# Patient Record
Sex: Female | Born: 1970 | Hispanic: No | Marital: Single | Smoking: Never smoker
Health system: Southern US, Community
[De-identification: ages and names within clinical notes are randomized; demographics above are authoritative.]

---

## 2001-04-24 ENCOUNTER — Other Ambulatory Visit: Admission: RE | Admit: 2001-04-24 | Discharge: 2001-04-24 | Payer: Self-pay | Admitting: Obstetrics and Gynecology

## 2002-05-02 ENCOUNTER — Other Ambulatory Visit: Admission: RE | Admit: 2002-05-02 | Discharge: 2002-05-02 | Payer: Self-pay | Admitting: Obstetrics and Gynecology

## 2015-07-26 ENCOUNTER — Emergency Department (HOSPITAL_COMMUNITY): Payer: Worker's Compensation

## 2015-07-26 ENCOUNTER — Encounter (HOSPITAL_COMMUNITY): Payer: Self-pay | Admitting: Emergency Medicine

## 2015-07-26 ENCOUNTER — Emergency Department (HOSPITAL_COMMUNITY)
Admission: EM | Admit: 2015-07-26 | Discharge: 2015-07-27 | Disposition: A | Discharge status: Home / Self Care | Payer: Worker's Compensation | Attending: Emergency Medicine | Admitting: Emergency Medicine

## 2015-07-26 DIAGNOSIS — Y9389 Activity, other specified: Secondary | ICD-10-CM | POA: Insufficient documentation

## 2015-07-26 DIAGNOSIS — Y9289 Other specified places as the place of occurrence of the external cause: Secondary | ICD-10-CM | POA: Diagnosis not present

## 2015-07-26 DIAGNOSIS — Y990 Civilian activity done for income or pay: Secondary | ICD-10-CM | POA: Diagnosis not present

## 2015-07-26 DIAGNOSIS — W268XXA Contact with other sharp object(s), not elsewhere classified, initial encounter: Secondary | ICD-10-CM | POA: Diagnosis not present

## 2015-07-26 DIAGNOSIS — Z23 Encounter for immunization: Secondary | ICD-10-CM | POA: Insufficient documentation

## 2015-07-26 DIAGNOSIS — S61317A Laceration without foreign body of left little finger with damage to nail, initial encounter: Secondary | ICD-10-CM | POA: Insufficient documentation

## 2015-07-26 DIAGNOSIS — S61217A Laceration without foreign body of left little finger without damage to nail, initial encounter: Secondary | ICD-10-CM

## 2015-07-26 MED ORDER — TETANUS-DIPHTH-ACELL PERTUSSIS 5-2.5-18.5 LF-MCG/0.5 IM SUSP
0.5000 mL | Freq: Once | INTRAMUSCULAR | Status: AC
  Administered 2015-07-27: 0.5 mL via INTRAMUSCULAR
  Filled 2015-07-26: qty 0.5

## 2015-07-26 MED ORDER — LIDOCAINE HCL 2 % IJ SOLN
10.0000 mL | Freq: Once | INTRAMUSCULAR | Status: AC
  Administered 2015-07-27: 100 mg
  Filled 2015-07-26: qty 20

## 2015-07-26 NOTE — ED Provider Notes (Signed)
CSN: [id number]     Arrival date & time 07/26/15  2029 History  By signing my name below, I, Octavia Heirrianna Nassar, attest that this documentation has been prepared under the direction and in the presence of Magdalene Tardiff, PA-C. Electronically Signed: Octavia HeirArianna Nassar, ED Scribe. 07/26/2015. 10:41 PM.    Chief Complaint  Patient presents with  . Extremity Laceration      The history is provided by the patient. No language interpreter was used.   HPI Comments: Melanie Gardner is a 45 y.o. female who presents to the Emergency Department complaining of a sudden onset, gradual worsening, moderate left pinky finger laceration onset about 3 hours ago. Pt states she was at work using a Production designer, theatre/television/filmpeeler when she cut her left pinky. She reports cutting through the nail. Pt says that her finger bled for awhile and currently has been intermittently bleeding. Pt states she has been icing her finger to help reduce swelling. She has not taken any medication to alleviate her pain. Pt is not up to date on her tetanus shot. She denies any other injuries.   History reviewed. No pertinent past medical history. History reviewed. No pertinent past surgical history. No family history on file. Social History  Substance Use Topics  . Smoking status: Never Smoker   . Smokeless tobacco: None  . Alcohol Use: Yes   OB History    No data available     Review of Systems  Skin: Positive for wound.      Allergies  Sulfa antibiotics  Home Medications   Prior to Admission medications   Not on File   Triage vitals: BP 169/91 mmHg  Pulse 95  Temp(Src) 98.3 F (36.8 C) (Oral)  Resp 18  Ht 5\' 1"  (1.549 m)  Wt 140 lb (63.504 kg)  BMI 26.47 kg/m2  SpO2 100%  LMP 07/12/2015 (Approximate) Physical Exam  Constitutional: She is oriented to person, place, and time. She appears well-developed and well-nourished.  HENT:  Head: Normocephalic.  Eyes: EOM are normal.  Neck: Normal range of motion.  Pulmonary/Chest: Effort  normal.  Abdominal: She exhibits no distension.  Musculoskeletal: Normal range of motion.  Neurological: She is alert and oriented to person, place, and time.  Skin:  Laceration to the tip of left 5th finger involving the nail. Hemostatic. Flap laceration  Psychiatric: She has a normal mood and affect.  Nursing note and vitals reviewed.   ED Course  Procedures  DIAGNOSTIC STUDIES: Oxygen Saturation is 100% on RA, normal by my interpretation.  COORDINATION OF CARE: 10:39 PM Will order x-ray of left pinky finger. Discussed treatment plan which includes washing out wound and suture the area with pt at bedside and pt agreed to plan.  Labs Review Labs Reviewed - No data to display  Imaging Review Dg Finger Little Left  07/26/2015  CLINICAL DATA:  Laceration to finger with a vegetable peeler at work tonight. EXAM: LEFT LITTLE FINGER 2+V COMPARISON:  None. FINDINGS: Negative acute fracture, dislocation or radiopaque foreign body. IMPRESSION: Negative. Electronically Signed   By: Ellery Plunkaniel R Mitchell M.D.   On: 07/26/2015 23:18   I have personally reviewed and evaluated these images and lab results as part of my medical decision-making.   EKG Interpretation None      NERVE BLOCK Performed by: Jaynie CrumbleKIRICHENKO, Tannen Vandezande A Consent: Verbal consent obtained. Required items: required blood products, implants, devices, and special equipment available Time out: Immediately prior to procedure a "time out" was called to verify the correct patient, procedure, equipment,  support staff and site/side marked as required.  Indication: laceration left 5th finger Nerve block body site: left 5th finger  Preparation: Patient was prepped and draped in the usual sterile fashion. Needle gauge: 24 G Location technique: anatomical landmarks  Local anesthetic: lidocaine 2% wo epi  Anesthetic total: 3 ml  Outcome: pain improved Patient tolerance: Patient tolerated the procedure well with no immediate  complications.  LACERATION REPAIR Performed by: Lottie Mussel Authorized by: Jaynie Crumble A Consent: Verbal consent obtained. Risks and benefits: risks, benefits and alternatives were discussed Consent given by: patient Patient identity confirmed: provided demographic data Prepped and Draped in normal sterile fashion Wound explored  Laceration Location: left 5th finger  Laceration Length: 2cm  No Foreign Bodies seen or palpated  Anesthesia: nerve block  Local anesthetic: lidocaine 2% wo epinephrine  Anesthetic total: 3 ml  Irrigation method: syringe Amount of cleaning: standard  Skin closure: vicril rapid 5.0  Number of sutures: 5, two through nail  Technique: simple interrupted  Patient tolerance: Patient tolerated the procedure well with no immediate complications.   MDM   Final diagnoses:  Laceration of fifth finger, left, initial encounter   Pt in ED with laceration to the left 5th finger. Laceration repaired with sutures. Nail reattached together. Bleeding controlled. Negative xray. tdap updated. Home with wound care and follow up for recheck as needed.   Filed Vitals:   07/26/15 2033 07/26/15 2309  BP: 169/91 144/97  Pulse: 95 82  Temp: 98.3 F (36.8 C)   TempSrc: Oral   Resp: 18 20  Height:  (1.549 m)   Weight: 63.504 kg   SpO2: 100% 100%   I personally performed the services described in this documentation, which was scribed in my presence. The recorded information has been reviewed and is accurate.   Jaynie Crumble, PA-C 07/27/15 0507  Tilden Fossa, MD 07/27/15 [phone]

## 2015-07-26 NOTE — ED Notes (Signed)
Patient reports cutting left pinky with peeler at work PTA. Dressing applied PTA, bleeding currently, new dressing applied in triage. Laceration through nail bed approximately 1/4". Rates pain 2/10.

## 2015-07-26 NOTE — Discharge Instructions (Signed)
Ibuprofen or tylenol for pain. Bacitracin twice a day. Keep finger clean. Follow up with primary care doctor or here if sutures do not dissolve in 1week-10 days. Return if any signs of infection.    Laceration Care, Adult A laceration is a cut that goes through all of the layers of the skin and into the tissue that is right under the skin. Some lacerations heal on their own. Others need to be closed with stitches (sutures), staples, skin adhesive strips, or skin glue. Proper laceration care minimizes the risk of infection and helps the laceration to heal better. HOW TO CARE FOR YOUR LACERATION If sutures or staples were used:  Keep the wound clean and dry.  If you were given a bandage (dressing), you should change it at least one time per day or as told by your health care provider. You should also change it if it becomes wet or dirty.  Keep the wound completely dry for the first 24 hours or as told by your health care provider. After that time, you may shower or bathe. However, make sure that the wound is not soaked in water until after the sutures or staples have been removed.  Clean the wound one time each day or as told by your health care provider:  Wash the wound with soap and water.  Rinse the wound with water to remove all soap.  Pat the wound dry with a clean towel. Do not rub the wound.  After cleaning the wound, apply a thin layer of antibiotic ointmentas told by your health care provider. This will help to prevent infection and keep the dressing from sticking to the wound.  Have the sutures or staples removed as told by your health care provider. If skin adhesive strips were used:  Keep the wound clean and dry.  If you were given a bandage (dressing), you should change it at least one time per day or as told by your health care provider. You should also change it if it becomes dirty or wet.  Do not get the skin adhesive strips wet. You may shower or bathe, but be careful  to keep the wound dry.  If the wound gets wet, pat it dry with a clean towel. Do not rub the wound.  Skin adhesive strips fall off on their own. You may trim the strips as the wound heals. Do not remove skin adhesive strips that are still stuck to the wound. They will fall off in time. If skin glue was used:  Try to keep the wound dry, but you may briefly wet it in the shower or bath. Do not soak the wound in water, such as by swimming.  After you have showered or bathed, gently pat the wound dry with a clean towel. Do not rub the wound.  Do not do any activities that will make you sweat heavily until the skin glue has fallen off on its own.  Do not apply liquid, cream, or ointment medicine to the wound while the skin glue is in place. Using those may loosen the film before the wound has healed.  If you were given a bandage (dressing), you should change it at least one time per day or as told by your health care provider. You should also change it if it becomes dirty or wet.  If a dressing is placed over the wound, be careful not to apply tape directly over the skin glue. Doing that may cause the glue to be pulled  off before the wound has healed.  Do not pick at the glue. The skin glue usually remains in place for 5-10 days, then it falls off of the skin. General Instructions  Take over-the-counter and prescription medicines only as told by your health care provider.  If you were prescribed an antibiotic medicine or ointment, take or apply it as told by your doctor. Do not stop using it even if your condition improves.  To help prevent scarring, make sure to cover your wound with sunscreen whenever you are outside after stitches are removed, after adhesive strips are removed, or when glue remains in place and the wound is healed. Make sure to wear a sunscreen of at least 30 SPF.  Do not scratch or pick at the wound.  Keep all follow-up visits as told by your health care provider. This  is important.  Check your wound every day for signs of infection. Watch for:  Redness, swelling, or pain.  Fluid, blood, or pus.  Raise (elevate) the injured area above the level of your heart while you are sitting or lying down, if possible. SEEK MEDICAL CARE IF:  You received a tetanus shot and you have swelling, severe pain, redness, or bleeding at the injection site.  You have a fever.  A wound that was closed breaks open.  You notice a bad smell coming from your wound or your dressing.  You notice something coming out of the wound, such as wood or glass.  Your pain is not controlled with medicine.  You have increased redness, swelling, or pain at the site of your wound.  You have fluid, blood, or pus coming from your wound.  You notice a change in the color of your skin near your wound.  You need to change the dressing frequently due to fluid, blood, or pus draining from the wound.  You develop a new rash.  You develop numbness around the wound. SEEK IMMEDIATE MEDICAL CARE IF:  You develop severe swelling around the wound.  Your pain suddenly increases and is severe.  You develop painful lumps near the wound or on skin that is anywhere on your body.  You have a red streak going away from your wound.  The wound is on your hand or foot and you cannot properly move a finger or toe.  The wound is on your hand or foot and you notice that your fingers or toes look pale or bluish.   This information is not intended to replace advice given to you by your health care provider. Make sure you discuss any questions you have with your health care provider.   Document Released: 03/01/2005 Document Revised: 07/16/2014 Document Reviewed: 02/25/2014 [employer] Interactive Patient Education [employer]2016 [employer]

## 2016-12-11 IMAGING — CR DG FINGER LITTLE 2+V*L*
3 series · 3 of 3 positions shown · non-contrast
Comparison: None.

CLINICAL DATA: Laceration to finger with a vegetable peeler at work
tonight.

EXAM:
LEFT LITTLE FINGER 2+V

[x finger pa left]
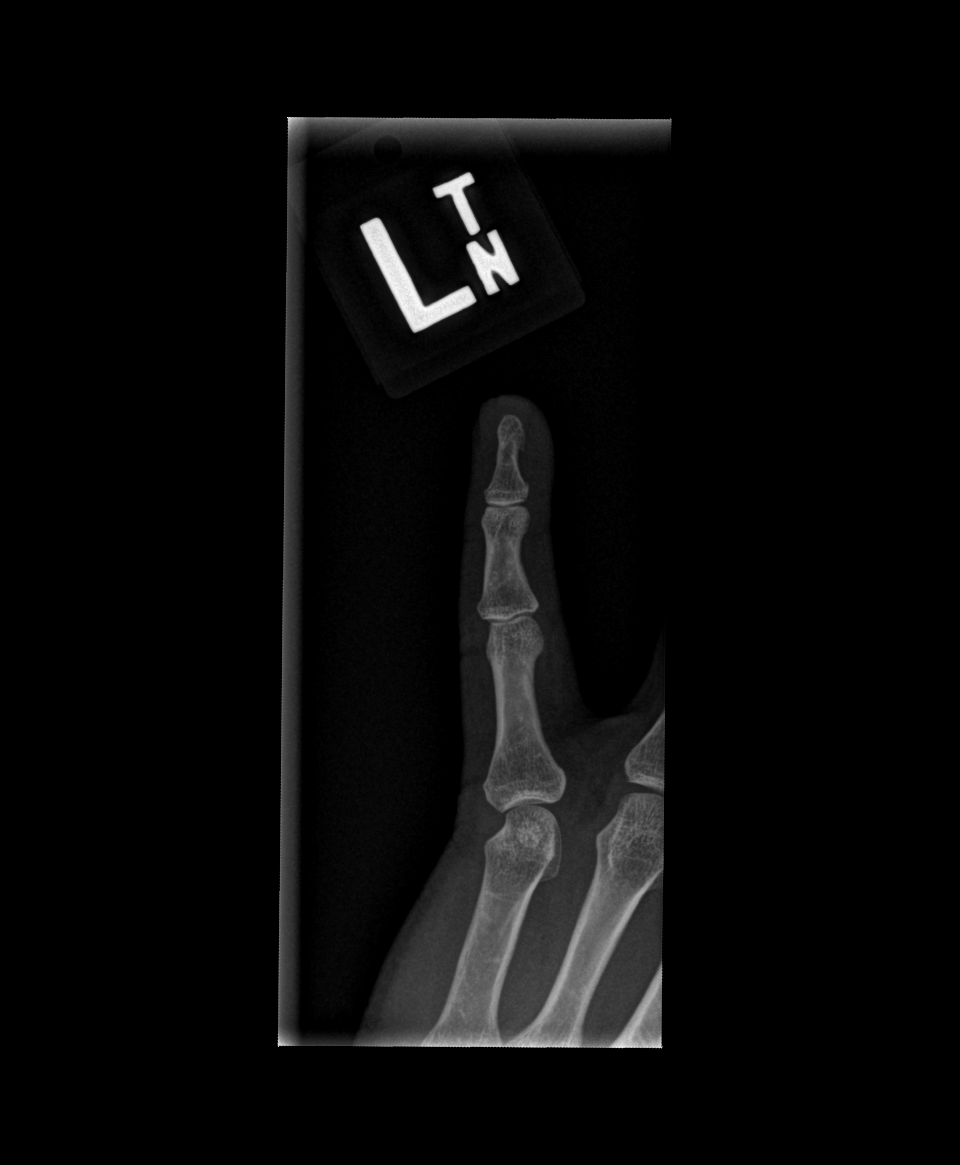

[x finger obl left]
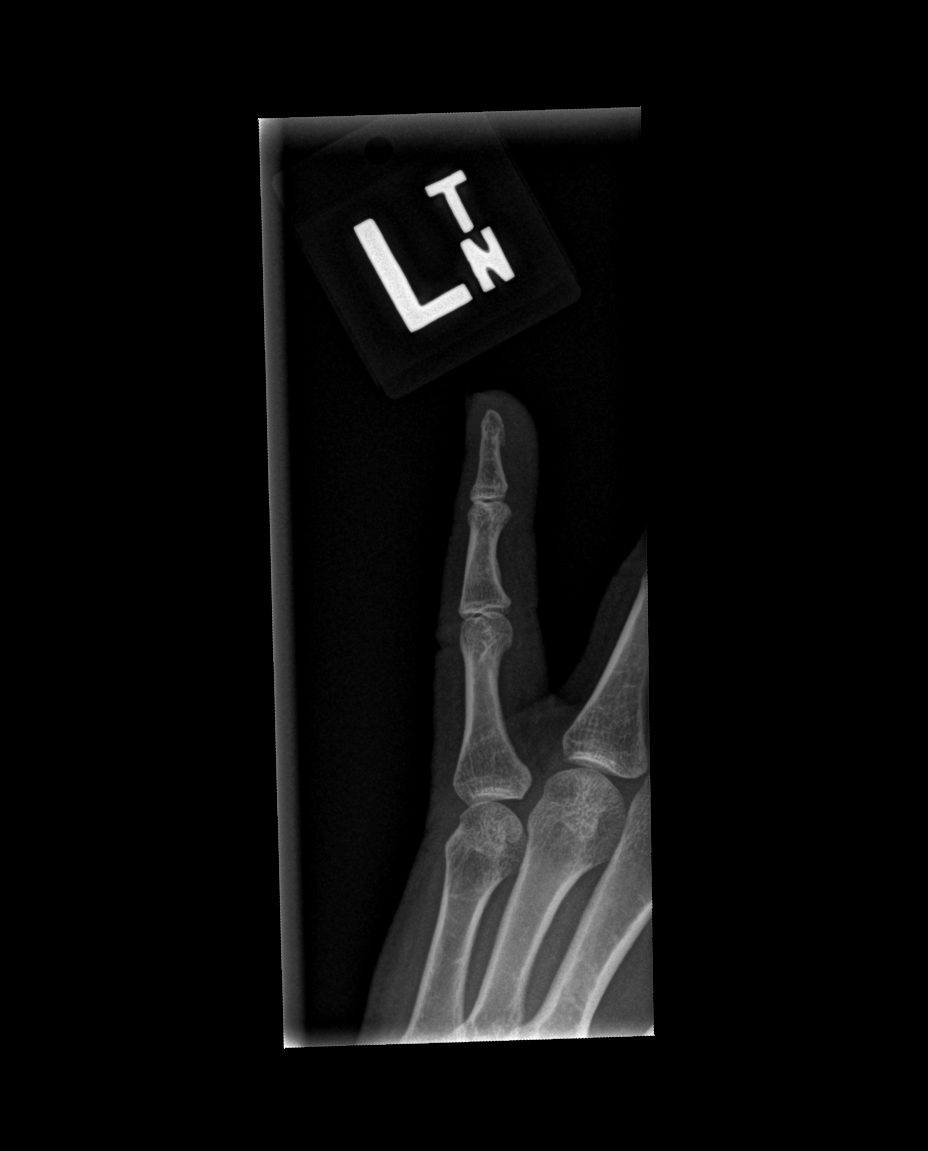

[x finger lat left]
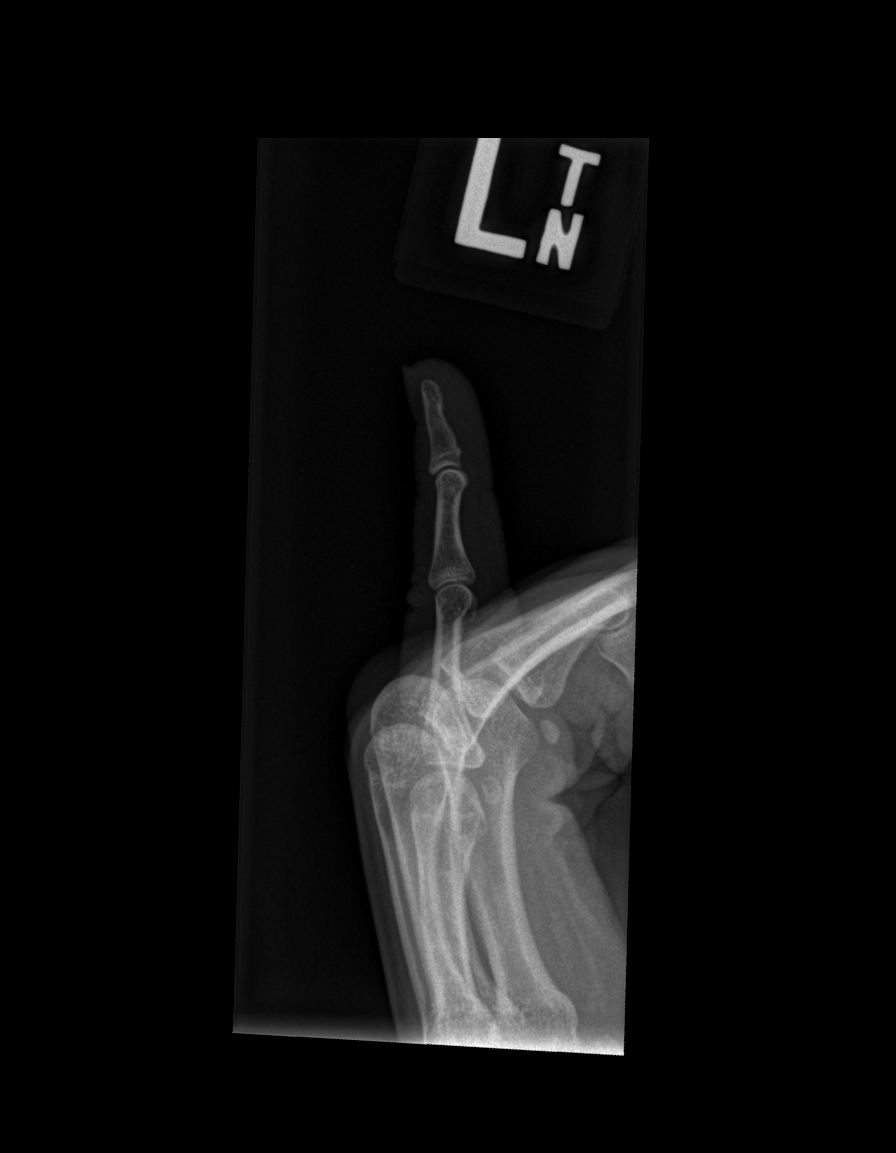

[3 of 3 positions shown; findings below may reference images not displayed]

FINDINGS: Negative acute fracture, dislocation or radiopaque foreign body.
IMPRESSION: Negative.
